# Patient Record
Sex: Female | Born: 1950 | Race: Black or African American | Hispanic: No | Smoking: Never smoker
Health system: Southern US, Community
[De-identification: ages and names within clinical notes are randomized; demographics above are authoritative.]

## PROBLEM LIST (undated history)

## (undated) DIAGNOSIS — I10 Essential (primary) hypertension: Secondary | ICD-10-CM

## (undated) DIAGNOSIS — E119 Type 2 diabetes mellitus without complications: Secondary | ICD-10-CM

## (undated) DIAGNOSIS — C801 Malignant (primary) neoplasm, unspecified: Secondary | ICD-10-CM

## (undated) DIAGNOSIS — I509 Heart failure, unspecified: Secondary | ICD-10-CM

## (undated) HISTORY — DX: Type 2 diabetes mellitus without complications: E11.9

## (undated) HISTORY — DX: Malignant (primary) neoplasm, unspecified: C80.1

## (undated) HISTORY — PX: EYE SURGERY: SHX253

## (undated) HISTORY — PX: CHOLECYSTECTOMY: SHX55

## (undated) HISTORY — DX: Heart failure, unspecified: I50.9

## (undated) HISTORY — DX: Essential (primary) hypertension: I10

## (undated) HISTORY — PX: ATRIAL FIBRILLATION ABLATION: EP1191

## (undated) HISTORY — PX: ABDOMINAL HYSTERECTOMY: SHX81

---

## 2013-05-03 DIAGNOSIS — I10 Essential (primary) hypertension: Secondary | ICD-10-CM | POA: Insufficient documentation

## 2013-05-03 DIAGNOSIS — I4729 Other ventricular tachycardia: Secondary | ICD-10-CM | POA: Insufficient documentation

## 2013-05-03 DIAGNOSIS — R55 Syncope and collapse: Secondary | ICD-10-CM | POA: Insufficient documentation

## 2013-05-03 DIAGNOSIS — I472 Ventricular tachycardia: Secondary | ICD-10-CM | POA: Insufficient documentation

## 2013-05-03 DIAGNOSIS — I4719 Other supraventricular tachycardia: Secondary | ICD-10-CM | POA: Insufficient documentation

## 2013-05-03 DIAGNOSIS — E119 Type 2 diabetes mellitus without complications: Secondary | ICD-10-CM | POA: Insufficient documentation

## 2013-05-03 DIAGNOSIS — I428 Other cardiomyopathies: Secondary | ICD-10-CM | POA: Insufficient documentation

## 2013-05-03 DIAGNOSIS — I471 Supraventricular tachycardia: Secondary | ICD-10-CM | POA: Insufficient documentation

## 2013-06-28 ENCOUNTER — Encounter: Payer: Self-pay | Admitting: *Deleted

## 2013-06-28 ENCOUNTER — Encounter: Payer: No Typology Code available for payment source | Attending: Family Medicine | Admitting: *Deleted

## 2013-06-28 VITALS — Ht 65.0 in | Wt 182.0 lb

## 2013-06-28 DIAGNOSIS — R5381 Other malaise: Secondary | ICD-10-CM | POA: Insufficient documentation

## 2013-06-28 DIAGNOSIS — R5383 Other fatigue: Secondary | ICD-10-CM

## 2013-06-28 DIAGNOSIS — Z713 Dietary counseling and surveillance: Secondary | ICD-10-CM | POA: Insufficient documentation

## 2013-06-28 DIAGNOSIS — E119 Type 2 diabetes mellitus without complications: Secondary | ICD-10-CM | POA: Insufficient documentation

## 2013-06-28 DIAGNOSIS — H548 Legal blindness, as defined in USA: Secondary | ICD-10-CM | POA: Insufficient documentation

## 2013-06-28 NOTE — Patient Instructions (Addendum)
Plan:  Aim for 2-3 Carb Choices per meal (30-45 grams) +/- 1 either way  Aim for 0-15 Carbs per snack if hungry  Include protein in moderation with your meals and snacks Consider reading food labels for Total Carbohydrate and Fat Grams of foods Consider  increasing your activity level by as tolerated Continue checking BG at FBS and evening times per day as directed by MD  Consider starting Lantus as directed by MD  Always have a protein when you have a carbohydrate Consider Lynnae Sandhoff / [facility] reduced calorie (45-50 cal ) bread Consider [place] for snack

## 2013-06-28 NOTE — Progress Notes (Signed)
Appt start time: 0830 end time:  1000.  Assessment:  Patient was seen on  06/28/13 for individual diabetes education. She was diagnosed approximately 20 yrs ago. Robyn Shaw presents in frail and in a wheel chair accompanied by her daughter Robyn Shaw.She is ambulatory yet fatigues easily as well as being challenged by visual impairment. Robyn Shaw was recently widowed in 04/2013. She has had cataract surgery and recent hospitalization for CHF. She is legally blind and is preparing for further eye surgery. She presently lives with her son and grandson. Her daughter is with her during the day M-TH, she is alone on Friday and her son is home on the week-end.   Current HbA1c: 8.4% 2015, up from 6.0% in 2013  Preferred Learning Style:   Auditory  Hands on  Learning Readiness:   Change in progress  MEDICATIONS: See List: Glipizide, Novolog sliding scale beginning at 1u > 150mg /dl. She tests FBS and evening annd utilizes SS 1-2 times daily. Lantus 10u HS has been ordered to increase 1u every 2 days to a FBS goal of <120. They has not started this yet as they are awaiting insurance approval for the "pen". I have encouraged them to follow up with insurance today and if approval will be delayed to begin with vial and syringe. Robyn Shaw goal is to return to independ living in her own home.  DIETARY INTAKE:  Beverages: water, sugar free lemonade, diet soda, decaf tea with splenda,   Usual physical activity: none    Intervention:  Nutrition counseling provided.  Discussed diabetes disease process and treatment options.  Discussed physiology of diabetes and role of obesity on insulin resistance.  Encouraged moderate weight reduction to improve glucose levels.  Discussed role of medications and diet in glucose control  Provided education on macronutrients on glucose levels.  Provided education on carb counting, importance of regularly scheduled meals/snacks, and meal planning  Discussed effects of  physical activity on glucose levels and long-term glucose control.  Recommended 150 minutes of physical activity/week.  Reviewed patient medications.  Discussed role of medication on blood glucose and possible side effects  Discussed blood glucose monitoring and interpretation.  Discussed recommended target ranges and individual ranges.    Described short-term complications: hyper- and hypo-glycemia.  Discussed causes,symptoms, and treatment options.  Discussed prevention, detection, and treatment of long-term complications.  Discussed the role of prolonged elevated glucose levels on body systems.  Discussed role of stress on blood glucose levels and discussed strategies to manage psychosocial issues.  Discussed recommendations for long-term diabetes self-care.  Established checklist for medical, dental, and emotional self-care.  Plan:  Aim for 2-3 Carb Choices per meal (30-45 grams) +/- 1 either way  Aim for 0-15 Carbs per snack if hungry  Include protein in moderation with your meals and snacks Consider reading food labels for Total Carbohydrate and Fat Grams of foods Consider  increasing your activity level by as tolerated Continue checking BG at FBS and evening times per day as directed by MD  Consider starting Lantus as directed by MD  Always have a protein when you have a carbohydrate Consider Robyn Shaw / Robyn Shaw reduced calorie (45-50 cal ) bread Consider Robyn Shaw for snack  Teaching Method Utilized:  Visual Auditory Hands on  Handouts given during visit include: Living Well with Diabetes Carb Counting and Food Label handouts Meal Plan Card Snack sheet  Barriers to learning/adherence to lifestyle change: General health condition  Diabetes self-care support plan:   [facility]  support group  family  Demonstrated degree of understanding via:  Teach Back   Monitoring/Evaluation:  Dietary intake, exercise, test glucose, and body weight return for f/u  prn.

## 2013-06-29 ENCOUNTER — Ambulatory Visit
Payer: No Typology Code available for payment source | Attending: Family Medicine | Admitting: Rehabilitative and Restorative Service Providers"

## 2013-06-29 DIAGNOSIS — M6281 Muscle weakness (generalized): Secondary | ICD-10-CM | POA: Insufficient documentation

## 2013-06-29 DIAGNOSIS — R269 Unspecified abnormalities of gait and mobility: Secondary | ICD-10-CM | POA: Insufficient documentation

## 2013-06-29 DIAGNOSIS — IMO0001 Reserved for inherently not codable concepts without codable children: Secondary | ICD-10-CM | POA: Insufficient documentation

## 2013-07-03 ENCOUNTER — Ambulatory Visit: Payer: No Typology Code available for payment source | Admitting: Rehabilitative and Restorative Service Providers"

## 2013-07-05 ENCOUNTER — Ambulatory Visit: Payer: No Typology Code available for payment source | Admitting: Rehabilitative and Restorative Service Providers"

## 2013-07-10 ENCOUNTER — Ambulatory Visit: Payer: No Typology Code available for payment source | Admitting: Rehabilitative and Restorative Service Providers"

## 2013-07-11 ENCOUNTER — Encounter: Payer: No Typology Code available for payment source | Admitting: Rehabilitative and Restorative Service Providers"

## 2013-07-12 ENCOUNTER — Encounter: Payer: No Typology Code available for payment source | Admitting: Rehabilitative and Restorative Service Providers"

## 2013-07-17 ENCOUNTER — Encounter: Payer: No Typology Code available for payment source | Admitting: Rehabilitative and Restorative Service Providers"

## 2013-07-18 ENCOUNTER — Ambulatory Visit
Payer: No Typology Code available for payment source | Attending: Family Medicine | Admitting: Rehabilitative and Restorative Service Providers"

## 2013-07-18 DIAGNOSIS — IMO0001 Reserved for inherently not codable concepts without codable children: Secondary | ICD-10-CM | POA: Insufficient documentation

## 2013-07-18 DIAGNOSIS — M6281 Muscle weakness (generalized): Secondary | ICD-10-CM | POA: Insufficient documentation

## 2013-07-18 DIAGNOSIS — R269 Unspecified abnormalities of gait and mobility: Secondary | ICD-10-CM | POA: Insufficient documentation

## 2013-07-19 ENCOUNTER — Ambulatory Visit: Payer: No Typology Code available for payment source | Admitting: Rehabilitative and Restorative Service Providers"

## 2013-08-01 DIAGNOSIS — I2510 Atherosclerotic heart disease of native coronary artery without angina pectoris: Secondary | ICD-10-CM | POA: Insufficient documentation

## 2013-08-01 DIAGNOSIS — Z95810 Presence of automatic (implantable) cardiac defibrillator: Secondary | ICD-10-CM | POA: Insufficient documentation

## 2013-09-07 DIAGNOSIS — I480 Paroxysmal atrial fibrillation: Secondary | ICD-10-CM | POA: Insufficient documentation

## 2013-10-09 ENCOUNTER — Ambulatory Visit: Payer: No Typology Code available for payment source | Attending: Family Medicine

## 2013-10-09 DIAGNOSIS — M129 Arthropathy, unspecified: Secondary | ICD-10-CM | POA: Insufficient documentation

## 2013-10-09 DIAGNOSIS — M25519 Pain in unspecified shoulder: Secondary | ICD-10-CM | POA: Insufficient documentation

## 2013-10-09 DIAGNOSIS — E119 Type 2 diabetes mellitus without complications: Secondary | ICD-10-CM | POA: Insufficient documentation

## 2013-10-09 DIAGNOSIS — IMO0001 Reserved for inherently not codable concepts without codable children: Secondary | ICD-10-CM | POA: Diagnosis present

## 2013-10-09 DIAGNOSIS — R5381 Other malaise: Secondary | ICD-10-CM | POA: Insufficient documentation

## 2013-10-09 DIAGNOSIS — Z950 Presence of cardiac pacemaker: Secondary | ICD-10-CM | POA: Diagnosis not present

## 2013-10-09 DIAGNOSIS — M25619 Stiffness of unspecified shoulder, not elsewhere classified: Secondary | ICD-10-CM | POA: Insufficient documentation

## 2013-10-11 ENCOUNTER — Ambulatory Visit: Payer: No Typology Code available for payment source | Admitting: Physical Therapy

## 2013-10-11 DIAGNOSIS — IMO0001 Reserved for inherently not codable concepts without codable children: Secondary | ICD-10-CM | POA: Diagnosis not present

## 2013-10-16 ENCOUNTER — Ambulatory Visit: Payer: No Typology Code available for payment source | Admitting: Physical Therapy

## 2013-10-16 DIAGNOSIS — IMO0001 Reserved for inherently not codable concepts without codable children: Secondary | ICD-10-CM | POA: Diagnosis not present

## 2013-10-26 ENCOUNTER — Ambulatory Visit: Payer: No Typology Code available for payment source | Admitting: Physical Therapy

## 2013-10-26 DIAGNOSIS — IMO0001 Reserved for inherently not codable concepts without codable children: Secondary | ICD-10-CM | POA: Diagnosis not present

## 2013-11-01 ENCOUNTER — Ambulatory Visit: Payer: No Typology Code available for payment source

## 2013-11-01 DIAGNOSIS — IMO0001 Reserved for inherently not codable concepts without codable children: Secondary | ICD-10-CM | POA: Diagnosis not present

## 2013-11-29 ENCOUNTER — Ambulatory Visit: Payer: No Typology Code available for payment source | Attending: Family Medicine

## 2013-11-29 DIAGNOSIS — E119 Type 2 diabetes mellitus without complications: Secondary | ICD-10-CM | POA: Diagnosis not present

## 2013-11-29 DIAGNOSIS — Z950 Presence of cardiac pacemaker: Secondary | ICD-10-CM | POA: Insufficient documentation

## 2013-11-29 DIAGNOSIS — IMO0001 Reserved for inherently not codable concepts without codable children: Secondary | ICD-10-CM | POA: Diagnosis not present

## 2013-11-29 DIAGNOSIS — R5381 Other malaise: Secondary | ICD-10-CM | POA: Diagnosis not present

## 2013-11-29 DIAGNOSIS — M25519 Pain in unspecified shoulder: Secondary | ICD-10-CM | POA: Insufficient documentation

## 2013-11-29 DIAGNOSIS — M25619 Stiffness of unspecified shoulder, not elsewhere classified: Secondary | ICD-10-CM | POA: Insufficient documentation

## 2013-11-29 DIAGNOSIS — M129 Arthropathy, unspecified: Secondary | ICD-10-CM | POA: Diagnosis not present

## 2013-12-06 ENCOUNTER — Ambulatory Visit: Payer: No Typology Code available for payment source

## 2013-12-06 DIAGNOSIS — IMO0001 Reserved for inherently not codable concepts without codable children: Secondary | ICD-10-CM | POA: Diagnosis not present

## 2013-12-11 ENCOUNTER — Ambulatory Visit: Payer: Medicare Other | Attending: Family Medicine

## 2013-12-11 DIAGNOSIS — E119 Type 2 diabetes mellitus without complications: Secondary | ICD-10-CM | POA: Insufficient documentation

## 2013-12-11 DIAGNOSIS — R5381 Other malaise: Secondary | ICD-10-CM | POA: Diagnosis not present

## 2013-12-11 DIAGNOSIS — M25612 Stiffness of left shoulder, not elsewhere classified: Secondary | ICD-10-CM | POA: Insufficient documentation

## 2013-12-11 DIAGNOSIS — Z5189 Encounter for other specified aftercare: Secondary | ICD-10-CM | POA: Diagnosis present

## 2013-12-11 DIAGNOSIS — M1990 Unspecified osteoarthritis, unspecified site: Secondary | ICD-10-CM | POA: Diagnosis not present

## 2013-12-11 DIAGNOSIS — M25512 Pain in left shoulder: Secondary | ICD-10-CM | POA: Insufficient documentation

## 2013-12-11 DIAGNOSIS — Z950 Presence of cardiac pacemaker: Secondary | ICD-10-CM | POA: Diagnosis not present

## 2014-06-11 DIAGNOSIS — H4310 Vitreous hemorrhage, unspecified eye: Secondary | ICD-10-CM | POA: Insufficient documentation

## 2014-11-20 DIAGNOSIS — T82198A Other mechanical complication of other cardiac electronic device, initial encounter: Secondary | ICD-10-CM | POA: Insufficient documentation

## 2016-02-04 DIAGNOSIS — G629 Polyneuropathy, unspecified: Secondary | ICD-10-CM | POA: Insufficient documentation

## 2016-02-04 DIAGNOSIS — R270 Ataxia, unspecified: Secondary | ICD-10-CM | POA: Insufficient documentation

## 2016-02-04 DIAGNOSIS — R251 Tremor, unspecified: Secondary | ICD-10-CM | POA: Insufficient documentation

## 2016-02-04 DIAGNOSIS — R600 Localized edema: Secondary | ICD-10-CM | POA: Insufficient documentation

## 2016-02-04 DIAGNOSIS — G508 Other disorders of trigeminal nerve: Secondary | ICD-10-CM | POA: Insufficient documentation

## 2016-02-04 DIAGNOSIS — E559 Vitamin D deficiency, unspecified: Secondary | ICD-10-CM | POA: Insufficient documentation

## 2016-02-04 DIAGNOSIS — R0989 Other specified symptoms and signs involving the circulatory and respiratory systems: Secondary | ICD-10-CM | POA: Insufficient documentation

## 2016-02-04 DIAGNOSIS — R42 Dizziness and giddiness: Secondary | ICD-10-CM | POA: Insufficient documentation

## 2016-02-04 DIAGNOSIS — R5383 Other fatigue: Secondary | ICD-10-CM | POA: Insufficient documentation

## 2016-02-04 DIAGNOSIS — H269 Unspecified cataract: Secondary | ICD-10-CM | POA: Insufficient documentation

## 2016-02-04 DIAGNOSIS — I110 Hypertensive heart disease with heart failure: Secondary | ICD-10-CM | POA: Insufficient documentation

## 2016-02-04 DIAGNOSIS — E113599 Type 2 diabetes mellitus with proliferative diabetic retinopathy without macular edema, unspecified eye: Secondary | ICD-10-CM | POA: Insufficient documentation

## 2016-02-04 DIAGNOSIS — E785 Hyperlipidemia, unspecified: Secondary | ICD-10-CM | POA: Insufficient documentation

## 2016-02-26 DIAGNOSIS — M7061 Trochanteric bursitis, right hip: Secondary | ICD-10-CM | POA: Insufficient documentation

## 2016-06-01 DIAGNOSIS — H02401 Unspecified ptosis of right eyelid: Secondary | ICD-10-CM | POA: Insufficient documentation

## 2016-07-03 DIAGNOSIS — H5440 Blindness, one eye, unspecified eye: Secondary | ICD-10-CM | POA: Insufficient documentation

## 2016-08-20 DIAGNOSIS — L602 Onychogryphosis: Secondary | ICD-10-CM | POA: Insufficient documentation

## 2016-11-15 DIAGNOSIS — R413 Other amnesia: Secondary | ICD-10-CM | POA: Insufficient documentation

## 2017-03-15 DIAGNOSIS — Z0000 Encounter for general adult medical examination without abnormal findings: Secondary | ICD-10-CM | POA: Insufficient documentation

## 2017-04-02 DIAGNOSIS — F419 Anxiety disorder, unspecified: Secondary | ICD-10-CM | POA: Insufficient documentation

## 2017-04-06 DIAGNOSIS — G4700 Insomnia, unspecified: Secondary | ICD-10-CM | POA: Insufficient documentation

## 2017-04-12 ENCOUNTER — Emergency Department (HOSPITAL_COMMUNITY): Payer: Medicare HMO

## 2017-04-12 ENCOUNTER — Other Ambulatory Visit: Payer: Self-pay

## 2017-04-12 ENCOUNTER — Emergency Department (HOSPITAL_COMMUNITY)
Admission: EM | Admit: 2017-04-12 | Discharge: 2017-04-12 | Disposition: A | Discharge status: Home / Self Care | Payer: Medicare HMO | Attending: Emergency Medicine | Admitting: Emergency Medicine

## 2017-04-12 ENCOUNTER — Encounter (HOSPITAL_COMMUNITY): Payer: Self-pay

## 2017-04-12 DIAGNOSIS — Z7982 Long term (current) use of aspirin: Secondary | ICD-10-CM | POA: Insufficient documentation

## 2017-04-12 DIAGNOSIS — Z8543 Personal history of malignant neoplasm of ovary: Secondary | ICD-10-CM | POA: Diagnosis not present

## 2017-04-12 DIAGNOSIS — E119 Type 2 diabetes mellitus without complications: Secondary | ICD-10-CM | POA: Insufficient documentation

## 2017-04-12 DIAGNOSIS — I509 Heart failure, unspecified: Secondary | ICD-10-CM | POA: Diagnosis not present

## 2017-04-12 DIAGNOSIS — Z4502 Encounter for adjustment and management of automatic implantable cardiac defibrillator: Secondary | ICD-10-CM | POA: Diagnosis not present

## 2017-04-12 DIAGNOSIS — Z794 Long term (current) use of insulin: Secondary | ICD-10-CM | POA: Diagnosis not present

## 2017-04-12 DIAGNOSIS — I110 Hypertensive heart disease with heart failure: Secondary | ICD-10-CM | POA: Insufficient documentation

## 2017-04-12 DIAGNOSIS — Z79899 Other long term (current) drug therapy: Secondary | ICD-10-CM | POA: Diagnosis not present

## 2017-04-12 LAB — CBC
HCT: 40.4 % (ref 36.0–46.0)
Hemoglobin: 13.4 g/dL (ref 12.0–15.0)
MCH: 31.8 pg (ref 26.0–34.0)
MCHC: 33.2 g/dL (ref 30.0–36.0)
MCV: 96 fL (ref 78.0–100.0)
Platelets: 210 10*3/uL (ref 150–400)
RBC: 4.21 MIL/uL (ref 3.87–5.11)
RDW: 13.6 % (ref 11.5–15.5)
WBC: 7.2 10*3/uL (ref 4.0–10.5)

## 2017-04-12 LAB — BASIC METABOLIC PANEL
Anion gap: 13 (ref 5–15)
BUN: 27 mg/dL — ABNORMAL HIGH (ref 6–20)
CO2: 26 mmol/L (ref 22–32)
Calcium: 9.5 mg/dL (ref 8.9–10.3)
Chloride: 100 mmol/L — ABNORMAL LOW (ref 101–111)
Creatinine, Ser: 1.1 mg/dL — ABNORMAL HIGH (ref 0.44–1.00)
GFR calc Af Amer: 58 mL/min — ABNORMAL LOW (ref >60)
GFR calc non Af Amer: 50 mL/min — ABNORMAL LOW (ref >60)
Glucose, Bld: 140 mg/dL — ABNORMAL HIGH (ref 65–99)
Potassium: 3.8 mmol/L (ref 3.5–5.1)
Sodium: 139 mmol/L (ref 135–145)

## 2017-04-12 LAB — I-STAT TROPONIN, ED: Troponin i, poc: 0.7 ng/mL (ref 0.00–0.08)

## 2017-04-12 MED ORDER — METOPROLOL SUCCINATE ER 50 MG PO TB24: 50.0000 mg | ORAL_TABLET | Freq: Every day | ORAL | 0 refills | Status: AC

## 2017-04-12 MED ORDER — DIAZEPAM 5 MG PO TABS
5.0000 mg | ORAL_TABLET | Freq: Once | ORAL | Status: AC
  Administered 2017-04-12: 5 mg via ORAL
  Filled 2017-04-12: qty 1

## 2017-04-12 NOTE — ED Triage Notes (Addendum)
Per Pt, Pt is coming from home with complaints of Pacemaker shocking her four times in the middle of the night last night. Son reports that she woke up and reported feeling like someone was punching her in the chest. Pt reports that she was sleeping and they woke her up. Denies any pain or SOB at this time.

## 2017-04-12 NOTE — Discharge Instructions (Signed)
I discussed with Dr Jac Canavan. The cardiology office is to call you in the morning to arrange for follow-up.

## 2017-04-12 NOTE — ED Notes (Signed)
BS report arrived for patient

## 2017-04-12 NOTE — ED Notes (Signed)
Pt daughter verbalized understanding of discharge instructions.

## 2017-04-12 NOTE — ED Provider Notes (Addendum)
[facility] EMERGENCY DEPARTMENT Provider Note   CSN: [id number] Arrival date & time: 04/12/17  1055     History   Chief Complaint Chief Complaint  Patient presents with  . Pacemaker Shock    HPI Robyn Shaw is a 67 y.o. female.  HPI   67 year old female w/ a past hx of paroxysmal atrial fibrillation, nonobstructive coronary disease, nonischemic cardiomyopathy, status post ICD implantation for primary prevention presenting after her ICD discharged last night. She reports that this happened while she was sleeping.  She felt like she was "punched in the chest."  She said that it happened multiple times.  She reports that she was notified that it had fired 4 times.  She says that she has been in her usual state of health for the last few days.  No recent medication changes.  Currently feels nervous but no acute pain or dyspnea.  Past Medical History:  Diagnosis Date  . Cancer ([place])    ovarian  . CHF (congestive heart failure) ([place])   . Diabetes mellitus without complication ([place])   . Hypertension     There are no active problems to display for this patient.   Past Surgical History:  Procedure Laterality Date  . ABDOMINAL HYSTERECTOMY    . ATRIAL FIBRILLATION ABLATION    . CHOLECYSTECTOMY    . EYE SURGERY      OB History    No data available       Home Medications    Prior to Admission medications   Medication Sig Start Date End Date Taking? Authorizing Provider  aspirin 81 MG tablet Take 81 mg by mouth daily.    [provider]  atorvastatin (LIPITOR) 40 MG tablet Take 40 mg by mouth daily.    [provider]  carvedilol (COREG) 25 MG tablet Take 25 mg by mouth 2 (two) times daily with a meal.    [provider]  furosemide (LASIX) 80 MG tablet Take 80 mg by mouth 2 (two) times daily.    [provider]  glipiZIDE (GLUCOTROL) 5 MG tablet Take 5 mg by mouth daily before breakfast.    [provider]    insulin aspart (NOVOLOG) 100 UNIT/ML injection Inject 1 Units into the skin 2 (two) times daily. Novolog flexPen sliding scale beginning at 1u > 150mg /dl    [provider]  insulin glargine (LANTUS) 100 UNIT/ML injection Inject 10 Units into the skin at bedtime. Lantus 10 units to begin. Increase by 1u every 2 days to a goal of FBS 120mg /dl    [provider]  lisinopril (PRINIVIL,ZESTRIL) 10 MG tablet Take 20 mg by mouth. 10mg  tablets, 2 tablets in the morning and 1 tablet in the evening    [provider]  meclizine (ANTIVERT) 12.5 MG tablet Take 12.5 mg by mouth 4 (four) times daily as needed for dizziness.    [provider]  metoprolol succinate (TOPROL-XL) 25 MG 24 hr tablet Take 25 mg by mouth 2 (two) times daily.    [provider]  POTASSIUM CHLORIDE ER PO Take 20 mEq by mouth daily.    [provider]    Family History No family history on file.  Social History Social History   Tobacco Use  . Smoking status: Never Smoker  . Smokeless tobacco: Never Used  Substance Use Topics  . Alcohol use: No    Frequency: Never  . Drug use: No     Allergies   Patient has  no known allergies.   Review of Systems Review of Systems  All systems reviewed and negative, other than as noted in HPI.  Physical Exam Updated Vital Signs BP (!) 187/88 (BP Location: Right Arm)   Pulse 65   Temp 98.4 F (36.9 C) (Oral)   Resp (!) 22   Ht 5\' 5"  (1.651 m)   Wt 82.6 kg (182 lb)   SpO2 98%   BMI 30.29 kg/m   Physical Exam  Constitutional: She appears well-developed and well-nourished. No distress.  HENT:  Head: Normocephalic and atraumatic.  Eyes: Conjunctivae are normal. Right eye exhibits no discharge. Left eye exhibits no discharge.  Neck: Neck supple.  Cardiovascular: Normal rate, regular rhythm and normal heart sounds. Exam reveals no gallop and no friction rub.  No murmur heard. Pulmonary/Chest: Effort normal and breath  sounds normal. No respiratory distress.  Abdominal: Soft. She exhibits no distension. There is no tenderness.  Musculoskeletal: She exhibits no edema or tenderness.  Neurological: She is alert.  Skin: Skin is warm and dry.  Psychiatric: She has a normal mood and affect. Her behavior is normal. Thought content normal.  Nursing note and vitals reviewed.    ED Treatments / Results  Labs (all labs ordered are listed, but only abnormal results are displayed) Labs Reviewed  BASIC METABOLIC PANEL - Abnormal; Notable for the following components:      Result Value   Chloride 100 (*)    Glucose, Bld 140 (*)    BUN 27 (*)    Creatinine, Ser 1.10 (*)    GFR calc non Af Amer 50 (*)    GFR calc Af Amer 58 (*)    All other components within normal limits  I-STAT TROPONIN, ED - Abnormal; Notable for the following components:   Troponin i, poc 0.70 (*)    All other components within normal limits  CBC    EKG  EKG Interpretation  Date/Time:  Monday April 12 2017 11:06:26 EST Ventricular Rate:  67 PR Interval:  186 QRS Duration: 116 QT Interval:  418 QTC Calculation: 441 R Axis:   -83 Text Interpretation:  Normal sinus rhythm Left anterior fascicular block Left ventricular hypertrophy with QRS widening Cannot rule out Septal infarct , age undetermined Possible Lateral infarct , age undetermined Abnormal ECG No old tracing to compare Confirmed by Virgel Manifold [phone]) on 04/12/2017 4:35:57 PM       Radiology Dg Chest 2 View  Result Date: 04/12/2017 CLINICAL DATA:  For episodes of ICD firing last night. Sensation of being punched in the chest. No current symptoms. History of CHF. Never smoked. EXAM: CHEST  2 VIEW COMPARISON:  None in PACs FINDINGS: There elevation of the right hemidiaphragm. The lungs are clear. There is no pleural effusion or pneumothorax. The heart is top-normal in size. The pulmonary vascularity is normal. The ICD is in reasonable position. The bony thorax exhibits no  acute abnormality. IMPRESSION: There is no active cardiopulmonary disease. There elevation of the right hemidiaphragm of uncertain significance and duration. Electronically Signed   By: David  Martinique M.D.   On: 04/12/2017 12:06    Procedures Procedures (including critical care time)  Medications Ordered in ED Medications - No data to display   Initial Impression / Assessment and Plan / ED Course  I have reviewed the triage vital signs and the nursing notes.  Pertinent labs & imaging results that were available during my care of the patient were reviewed by me and considered in my medical  decision making (see chart for details).     67 year old female presenting after ICD discharge last night.  Sports coach.  We will interrogate then discuss with her cardiologist.  Currently no complaints aside from feeling nervous.  Her troponin is elevated but not surprising for ICD discharge multiple times. No significant CAD on previous cath.   Received call from [employer].  Shortly after 1 AM this morning patient had an episode of A. Fib/flutter with RVR with a rate of 212.  Threshold set at 200 so she was appropriately shocked at that time. Advised he would discussed with on-call EP. Pt's cardiologist is with [facility] though. Requested secretary page Dr Fuller Song, cardiology.   Discussed with Dr Jac Canavan. No acute changes. Cards office to contact her tomorrow to arrange follow-up.   4:52 PM Dr Jac Canavan advised increasing metoprolol to 75mg  BID from 50mg . She has her meds her and I reviewed them with her/daughter. She actually is taking 100mg  ER once daily. New prescription for 50mg  provided and advised her to take 100mg  in morning as she currently is and add 50mg  in the evening until she follows up.    Final Clinical Impressions(s) / ED Diagnoses   Final diagnoses:  ICD (implantable cardioverter-defibrillator) discharge    ED Discharge Orders    None         Virgel Manifold, MD 04/12/17 1638    Virgel Manifold, MD 04/12/17 1654

## 2017-05-04 ENCOUNTER — Ambulatory Visit: Payer: Medicare HMO | Admitting: Sports Medicine

## 2017-05-04 ENCOUNTER — Encounter: Payer: Self-pay | Admitting: Sports Medicine

## 2017-05-04 VITALS — BP 161/98 | HR 63 | Ht 66.0 in | Wt 212.0 lb

## 2017-05-04 DIAGNOSIS — E0842 Diabetes mellitus due to underlying condition with diabetic polyneuropathy: Secondary | ICD-10-CM

## 2017-05-04 DIAGNOSIS — H547 Unspecified visual loss: Secondary | ICD-10-CM

## 2017-05-04 DIAGNOSIS — M79675 Pain in left toe(s): Secondary | ICD-10-CM | POA: Diagnosis not present

## 2017-05-04 DIAGNOSIS — M79674 Pain in right toe(s): Secondary | ICD-10-CM | POA: Diagnosis not present

## 2017-05-04 DIAGNOSIS — B351 Tinea unguium: Secondary | ICD-10-CM | POA: Diagnosis not present

## 2017-05-04 NOTE — Progress Notes (Signed)
Subjective: Robyn Shaw is a 67 y.o. female patient with history of diabetes who presents to office today complaining of long,mildly painful nails  while ambulating in shoes; unable to trim. Patient states that the glucose reading this morning was was not recorded, patient is assisted by son who helps to relay history. Patient denies any new changes in medication or new problems. Reports that she hasn't had her nails trimmed in almost 2 years because of fear of doctors.  Review of Systems  Eyes:       Blind  All other systems reviewed and are negative.   There are no active problems to display for this patient.  Current Outpatient Medications on File Prior to Visit  Medication Sig Dispense Refill  . atorvastatin (LIPITOR) 40 MG tablet Take 40 mg by mouth daily.    . carvedilol (COREG) 25 MG tablet Take 25 mg by mouth 2 (two) times daily with a meal.    . CINNAMON PO Take by mouth.    . donepezil (ARICEPT) 5 MG tablet Take 5 mg by mouth daily.     Marland Kitchen FLUoxetine (PROZAC) 20 MG capsule Take 20 mg by mouth daily.  5  . furosemide (LASIX) 80 MG tablet Take 80 mg by mouth 2 (two) times daily.    Marland Kitchen GARLIC PO Take 4,259 mg by mouth daily.    . Ginger, Zingiber officinalis, 250 MG CAPS Take by mouth.    . insulin glargine (LANTUS) 100 UNIT/ML injection Inject 26 Units into the skin at bedtime.     Marland Kitchen lisinopril (PRINIVIL,ZESTRIL) 10 MG tablet Take 20 mg by mouth. '10mg'$  tablets, 2 tablets in the morning and 1 tablet in the evening    . metFORMIN (GLUCOPHAGE-XR) 500 MG 24 hr tablet Take 500 mg by mouth every evening.    . metoprolol succinate (TOPROL-XL) 100 MG 24 hr tablet Take 100 mg by mouth daily.     . metoprolol succinate (TOPROL-XL) 50 MG 24 hr tablet Take 1 tablet (50 mg total) by mouth daily. 30 tablet 0  . Misc Natural Products (GLUCOSAMINE CHOND COMPLEX/MSM) TABS Take by mouth.    . Multiple Vitamin (THERA) TABS Take 1 mg by mouth 2 (two) times daily.     . Omega-3 Fatty Acids (FISH OIL)  1000 MG CAPS Take 1,000 mg by mouth daily.    . ondansetron (ZOFRAN) 4 MG tablet Take 4 mg by mouth as needed.    Marland Kitchen OVER THE COUNTER MEDICATION Take 8.3 mg by mouth daily. Hemp oil    . potassium chloride (K-DUR) 10 MEQ tablet Take 20 mEq by mouth daily.    . QUEtiapine (SEROQUEL) 25 MG tablet Take 25 mg by mouth at bedtime.  5   No current facility-administered medications on file prior to visit.    No Known Allergies  Recent Results (from the past 2160 hour(s))  Basic metabolic panel     Status: Abnormal   Collection Time: 04/12/17 11:14 AM  Result Value Ref Range   Sodium 139 135 - 145 mmol/L   Potassium 3.8 3.5 - 5.1 mmol/L   Chloride 100 (L) 101 - 111 mmol/L   CO2 26 22 - 32 mmol/L   Glucose, Bld 140 (H) 65 - 99 mg/dL   BUN 27 (H) 6 - 20 mg/dL   Creatinine, Ser 1.10 (H) 0.44 - 1.00 mg/dL   Calcium 9.5 8.9 - 10.3 mg/dL   GFR calc non Af Amer 50 (L) >60 mL/min   GFR calc Af Wyvonnia Lora  58 (L) >60 mL/min    Comment: (NOTE) The eGFR has been calculated using the CKD EPI equation. This calculation has not been validated in all clinical situations. eGFR's persistently <60 mL/min signify possible Chronic Kidney Disease.    Anion gap 13 5 - 15    Comment: Performed at [facility] [address], [place] [address]  CBC     Status: None   Collection Time: 04/12/17 11:14 AM  Result Value Ref Range   WBC 7.2 4.0 - 10.5 K/uL   RBC 4.21 3.87 - 5.11 MIL/uL   Hemoglobin 13.4 12.0 - 15.0 g/dL   HCT 40.4 36.0 - 46.0 %   MCV 96.0 78.0 - 100.0 fL   MCH 31.8 26.0 - 34.0 pg   MCHC 33.2 30.0 - 36.0 g/dL   RDW 13.6 11.5 - 15.5 %   Platelets 210 150 - 400 K/uL    Comment: Performed at [facility] [address], [place], [place] [address]  I-stat troponin, ED     Status: Abnormal   Collection Time: 04/12/17 11:33 AM  Result Value Ref Range   Troponin i, poc 0.70 (HH) 0.00 - 0.08 ng/mL   Comment NOTIFIED PHYSICIAN    Comment 3            Comment: Due to the release  kinetics of cTnI, a negative result within the first hours of the onset of symptoms does not rule out myocardial infarction with certainty. If myocardial infarction is still suspected, repeat the test at appropriate intervals.     Objective: General: Patient is awake, alert, and oriented x 3 and in no acute distress.  Integument: Skin is warm, dry and supple bilateral. Nails are tender, long, thickened and dystrophic with subungual debris, consistent with onychomycosis, 1-5 bilateral. No signs of infection. No open lesions or preulcerative lesions present bilateral. Remaining integument unremarkable.  Vasculature:  Dorsalis Pedis pulse 1/4 bilateral. Posterior Tibial pulse  1/4 bilateral. Capillary fill time <3 sec 1-5 bilateral. Positive hair growth to the level of the digits.Temperature gradient within normal limits. + varicosities present bilateral. Trace edema present bilateral.   Neurology: The patient has diminished sensation measured with a 5.07/10g Semmes Weinstein Monofilament at all pedal sites bilateral . Vibratory sensation diminished bilateral with tuning fork. No Babinski sign present bilateral.   Musculoskeletal: Asymptomatic hammertoe pedal deformities noted bilateral. Muscular strength 5/5 in all lower extremity muscular groups bilateral without pain on range of motion. No tenderness with calf compression bilateral.  Assessment and Plan: Problem List Items Addressed This Visit    None    Visit Diagnoses    Pain due to onychomycosis of toenails of both feet    -  Primary   Diabetic polyneuropathy associated with diabetes mellitus due to underlying condition ([place])       Blind          -Examined patient. -Discussed and educated patient on diabetic foot care, especially with  regards to the vascular, neurological and musculoskeletal systems.  -Stressed the importance of good glycemic control and the detriment of not  controlling glucose levels in relation to the  foot. -Mechanically debrided all nails 1-5 bilateral using sterile nail nipper and filed with dremel without incident  -Answered all patient and sons questions -Patient to return  in 3 months for at risk foot care -Patient advised to call the office if any problems or questions arise in the meantime.  Landis Martins, DPM

## 2017-05-04 NOTE — Progress Notes (Signed)
   Subjective:    Patient ID: Robyn Shaw, female    DOB: 1950/04/15, 67 y.o.   MRN: [id number]  HPI  Chief Complaint  Patient presents with  . Diabetes    Well controlled - diabetic foot exam  . Nail Problem    bilateral elongated thickened toenails - she is unable to trim herself       Review of Systems  Eyes: Positive for visual disturbance.  Cardiovascular: Positive for palpitations.  Gastrointestinal: Positive for nausea.  Musculoskeletal: Positive for arthralgias, back pain, joint swelling and myalgias.  Neurological: Positive for dizziness, weakness and light-headedness.  Psychiatric/Behavioral: Positive for confusion.       Objective:   Physical Exam        Assessment & Plan:

## 2017-08-03 ENCOUNTER — Ambulatory Visit: Payer: Medicare HMO | Admitting: Sports Medicine

## 2017-08-03 ENCOUNTER — Encounter: Payer: Self-pay | Admitting: Sports Medicine

## 2017-08-03 DIAGNOSIS — M79675 Pain in left toe(s): Secondary | ICD-10-CM

## 2017-08-03 DIAGNOSIS — H547 Unspecified visual loss: Secondary | ICD-10-CM

## 2017-08-03 DIAGNOSIS — M79674 Pain in right toe(s): Secondary | ICD-10-CM | POA: Diagnosis not present

## 2017-08-03 DIAGNOSIS — B351 Tinea unguium: Secondary | ICD-10-CM | POA: Diagnosis not present

## 2017-08-03 DIAGNOSIS — E0842 Diabetes mellitus due to underlying condition with diabetic polyneuropathy: Secondary | ICD-10-CM | POA: Diagnosis not present

## 2017-08-03 NOTE — Progress Notes (Signed)
Subjective: Robyn Shaw is a 67 y.o. 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 Patient states that the glucose reading this morning was was not recorded, patient is assisted by son who helps to relay history. Denies any other acute issues since last visit.    There are no active problems to display for this patient.  Current Outpatient Medications on File Prior to Visit  Medication Sig Dispense Refill  . atorvastatin (LIPITOR) 40 MG tablet Take 40 mg by mouth daily.    . carvedilol (COREG) 25 MG tablet Take 25 mg by mouth 2 (two) times daily with a meal.    . CINNAMON PO Take by mouth.    . donepezil (ARICEPT) 5 MG tablet Take 5 mg by mouth daily.     Marland Kitchen FLUoxetine (PROZAC) 20 MG capsule Take 20 mg by mouth daily.  5  . furosemide (LASIX) 80 MG tablet Take 80 mg by mouth 2 (two) times daily.    Marland Kitchen GARLIC PO Take 1,937 mg by mouth daily.    . Ginger, Zingiber officinalis, 250 MG CAPS Take by mouth.    . insulin glargine (LANTUS) 100 UNIT/ML injection Inject 26 Units into the skin at bedtime.     Marland Kitchen lisinopril (PRINIVIL,ZESTRIL) 10 MG tablet Take 20 mg by mouth. 10mg  tablets, 2 tablets in the morning and 1 tablet in the evening    . metFORMIN (GLUCOPHAGE-XR) 500 MG 24 hr tablet Take 500 mg by mouth every evening.    . metoprolol succinate (TOPROL-XL) 100 MG 24 hr tablet Take 100 mg by mouth daily.     . metoprolol succinate (TOPROL-XL) 50 MG 24 hr tablet Take 1 tablet (50 mg total) by mouth daily. 30 tablet 0  . Misc Natural Products (GLUCOSAMINE CHOND COMPLEX/MSM) TABS Take by mouth.    . Multiple Vitamin (THERA) TABS Take 1 mg by mouth 2 (two) times daily.     . Omega-3 Fatty Acids (FISH OIL) 1000 MG CAPS Take 1,000 mg by mouth daily.    . ondansetron (ZOFRAN) 4 MG tablet Take 4 mg by mouth as needed.    Marland Kitchen OVER THE COUNTER MEDICATION Take 8.3 mg by mouth daily.  Hemp oil    . potassium chloride (K-DUR) 10 MEQ tablet Take 20 mEq by mouth daily.    . QUEtiapine (SEROQUEL) 25 MG tablet Take 25 mg by mouth at bedtime.  5   No current facility-administered medications on file prior to visit.    No Known Allergies  No results found for this or any previous visit (from the past 2160 hour(s)).  Objective: General: Patient is awake, alert, and oriented x 3 and in no acute distress, wheelchair assisted gait.  Integument: Skin is warm, dry and supple bilateral. Nails are tender, long, thickened and dystrophic with subungual debris, consistent with onychomycosis, 1-5 bilateral. No signs of infection. No open lesions or preulcerative lesions present bilateral. Remaining integument unremarkable.  Vasculature:  Dorsalis Pedis pulse 1/4 bilateral. Posterior Tibial pulse  1/4 bilateral. Capillary fill time <3 sec 1-5 bilateral. Positive hair growth to the level of the digits.Temperature gradient within normal limits. + varicosities present bilateral. Trace edema present bilateral ankles.   Neurology: The patient has diminished sensation measured with a 5.07/10g Semmes Weinstein Monofilament at all pedal sites bilateral . Vibratory sensation diminished bilateral with tuning fork. No Babinski sign present bilateral.   Musculoskeletal: Asymptomatic  hammertoe pedal deformities noted bilateral. Muscular strength 5/5 in all lower extremity muscular groups bilateral without pain on range of motion. No tenderness with calf compression bilateral.  Assessment and Plan: Problem List Items Addressed This Visit    None    Visit Diagnoses    Pain due to onychomycosis of toenails of both feet    -  Primary   Diabetic polyneuropathy associated with diabetes mellitus due to underlying condition ([place])       Blind          -Examined patient. -Discussed and educated patient on diabetic foot care, especially with  regards to the vascular, neurological and musculoskeletal  systems.  -Stressed the importance of good glycemic control and the detriment of not  controlling glucose levels in relation to the foot. -Mechanically debrided all nails 1-5 bilateral using sterile nail nipper and filed with dremel without incident  -Continue with wheelchair and assistance from son  -Patient to return  in 3 months for at risk foot care -Patient advised to call the office if any problems or questions arise in the meantime.  Landis Martins, DPM

## 2017-11-02 ENCOUNTER — Encounter: Payer: Self-pay | Admitting: Sports Medicine

## 2017-11-02 ENCOUNTER — Ambulatory Visit: Payer: Medicare HMO | Admitting: Sports Medicine

## 2017-11-02 DIAGNOSIS — E0842 Diabetes mellitus due to underlying condition with diabetic polyneuropathy: Secondary | ICD-10-CM | POA: Diagnosis not present

## 2017-11-02 DIAGNOSIS — H547 Unspecified visual loss: Secondary | ICD-10-CM | POA: Diagnosis not present

## 2017-11-02 DIAGNOSIS — M79674 Pain in right toe(s): Secondary | ICD-10-CM

## 2017-11-02 DIAGNOSIS — M79675 Pain in left toe(s): Secondary | ICD-10-CM

## 2017-11-02 DIAGNOSIS — B351 Tinea unguium: Secondary | ICD-10-CM

## 2017-11-02 NOTE — Progress Notes (Signed)
Subjective: Robyn Shaw is a 67 y.o. 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 Patient is assisted by son. Patient states that the glucose reading this morning was 121 this morning last A1c 6.1, denies any other issues.  Patient Active Problem List   Diagnosis Date Noted  . Insomnia 04/06/2017  . Anxiety 04/02/2017  . Encounter for Medicare annual wellness exam 03/15/2017  . Memory loss 11/15/2016  . Overgrown toenails 08/20/2016  . 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 Ptosis of eyelid, right 06/01/2016  . Trochanteric bursitis of right hip 02/26/2016  . Ataxia 02/04/2016  . Bilateral edema of lower extremity 02/04/2016  . 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 Cardiac LV ejection fraction 10-20% 02/04/2016  . Cataract 02/04/2016  . Congestive heart failure due to high blood pressure ([place]) 02/04/2016  . Fatigue 02/04/2016  . Hyperlipidemia 02/04/2016  . Neuropathy 02/04/2016  . Proliferative diabetic retinopathy associated with type 2 diabetes mellitus ([place]) 02/04/2016  . Tremor 02/04/2016  . Vertigo 02/04/2016  . Vitamin D deficiency 02/04/2016  . Inappropriate shocks from ICD (implantable cardioverter-defibrillator) 11/20/2014  . Vitreous hemorrhage ([place]) 06/11/2014  . Paroxysmal atrial fibrillation ([place]) 09/07/2013  . 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 Nonocclusive coronary atherosclerosis of native coronary artery 08/01/2013  . Nonischemic cardiomyopathy ([place]) 05/03/2013  . Essential hypertension 05/03/2013  . NSVT (nonsustained ventricular tachycardia) ([place]) 05/03/2013  . PAT (paroxysmal atrial tachycardia) ([place]) 05/03/2013  . Syncope 05/03/2013  . Type 2 diabetes mellitus ([place]) 05/03/2013   Current Outpatient Medications on File Prior to Visit  Medication Sig Dispense Refill  . atorvastatin (LIPITOR) 40 MG tablet  Take 40 mg by mouth daily. Pt takes 20 mg    . carvedilol (COREG) 25 MG tablet Take 25 mg by mouth 2 (two) times daily with a meal.    . CINNAMON PO Take by mouth.    . donepezil (ARICEPT) 5 MG tablet Take 5 mg by mouth daily.     Marland Kitchen FLUoxetine (PROZAC) 20 MG capsule Take 20 mg by mouth daily.  5  . furosemide (LASIX) 80 MG tablet Take 80 mg by mouth daily.     Marland Kitchen GARLIC PO Take 7,793 mg by mouth daily.    . Ginger, Zingiber officinalis, 250 MG CAPS Take by mouth.    . insulin glargine (LANTUS) 100 UNIT/ML injection Inject 26 Units into the skin at bedtime.     Marland Kitchen lisinopril (PRINIVIL,ZESTRIL) 10 MG tablet Take 20 mg by mouth. 10mg  tablets, 2 tablets in the morning and 1 tablet in the evening    . metFORMIN (GLUCOPHAGE-XR) 500 MG 24 hr tablet Take 500 mg by mouth every evening.    . metoprolol succinate (TOPROL-XL) 100 MG 24 hr tablet Take 100 mg by mouth daily.     . metoprolol succinate (TOPROL-XL) 50 MG 24 hr tablet Take 1 tablet (50 mg total) by mouth daily. 30 tablet 0  . Misc Natural Products (GLUCOSAMINE CHOND COMPLEX/MSM) TABS Take by mouth.    . Multiple Vitamin (THERA) TABS Take 1 mg by mouth 2 (two) times daily.     . Omega-3 Fatty Acids (FISH OIL) 1000 MG CAPS Take 1,000 mg by mouth daily.    . ondansetron (ZOFRAN) 4 MG tablet Take 4 mg by mouth as needed.    Marland Kitchen OVER THE COUNTER MEDICATION Take 8.3 mg by mouth daily. Hemp oil    .  potassium chloride (K-DUR) 10 MEQ tablet Take 20 mEq by mouth daily.    . QUEtiapine (SEROQUEL) 25 MG tablet Take 25 mg by mouth at bedtime.  5   No current facility-administered medications on file prior to visit.    Allergies  Allergen Reactions  . Cocoa     No results found for this or any previous visit (from the past 2160 hour(s)).  Objective: General: Patient is awake, alert, and oriented x 3 and in no acute distress, wheelchair assisted gait.  Integument: Skin is warm, dry and supple bilateral. Nails are tender, long, thickened and dystrophic  with subungual debris, consistent with onychomycosis, 1-5 bilateral. No signs of infection. No open lesions or preulcerative lesions present bilateral. Remaining integument unremarkable.  Vasculature:  Dorsalis Pedis pulse 1/4 bilateral. Posterior Tibial pulse  1/4 bilateral. Capillary fill time <3 sec 1-5 bilateral. Positive hair growth to the level of the digits.Temperature gradient within normal limits. + varicosities present bilateral. Trace edema present bilateral ankles.   Neurology: The patient has diminished sensation measured with a 5.07/10g Semmes Weinstein Monofilament at all pedal sites bilateral . Vibratory sensation diminished bilateral with tuning fork. No Babinski sign present bilateral.   Musculoskeletal: Asymptomatic hammertoe pedal deformities noted bilateral. Muscular strength 5/5 in all lower extremity muscular groups bilateral without pain on range of motion. No tenderness with calf compression bilateral.  Assessment and Plan: Problem List Items Addressed This Visit    None    Visit Diagnoses    Pain due to onychomycosis of toenails of both feet    -  Primary   Diabetic polyneuropathy associated with diabetes mellitus due to underlying condition ([place])       Blind         -Examined patient. -Discussed and educated patient on diabetic foot care, especially with  regards to the vascular, neurological and musculoskeletal systems.  -Stressed the importance of good glycemic control and the detriment of not  controlling glucose levels in relation to the foot. -Mechanically debrided all nails 1-5 bilateral using sterile nail nipper and filed with dremel without incident  -Continue with wheelchair and assistance from son as previous -Patient to return  in 3 months for at risk foot care -Patient advised to call the office if any problems or questions arise in the meantime.  Landis Martins, DPM

## 2018-02-01 ENCOUNTER — Ambulatory Visit: Payer: Medicare HMO | Admitting: Podiatry

## 2018-02-01 DIAGNOSIS — M79675 Pain in left toe(s): Secondary | ICD-10-CM

## 2018-02-01 DIAGNOSIS — E0842 Diabetes mellitus due to underlying condition with diabetic polyneuropathy: Secondary | ICD-10-CM

## 2018-02-01 DIAGNOSIS — M79674 Pain in right toe(s): Secondary | ICD-10-CM | POA: Diagnosis not present

## 2018-02-01 DIAGNOSIS — B351 Tinea unguium: Secondary | ICD-10-CM

## 2018-02-01 NOTE — Patient Instructions (Signed)
Onychomycosis/Fungal Toenails  WHAT IS IT? An infection that lies within the keratin of your nail plate that is caused by a fungus.  WHY ME? Fungal infections affect all ages, sexes, races, and creeds.  There may be many factors that predispose you to a fungal infection such as age, coexisting medical conditions such as diabetes, or an autoimmune disease; stress, medications, fatigue, genetics, etc.  Bottom line: fungus thrives in a warm, moist environment and your shoes offer such a location.  IS IT CONTAGIOUS? Theoretically, yes.  You do not want to share shoes, nail clippers or files with someone who has fungal toenails.  Walking around barefoot in the same room or sleeping in the same bed is unlikely to transfer the organism.  It is important to realize, however, that fungus can spread easily from one nail to the next on the same foot.  HOW DO WE TREAT THIS?  There are several ways to treat this condition.  Treatment may depend on many factors such as age, medications, pregnancy, liver and kidney conditions, etc.  It is best to ask your doctor which options are available to you.  1. No treatment.   Unlike many other medical concerns, you can live with this condition.  However for many people this can be a painful condition and may lead to ingrown toenails or a bacterial infection.  It is recommended that you keep the nails cut short to help reduce the amount of fungal nail. 2. Topical treatment.  These range from herbal remedies to prescription strength nail lacquers.  About 40-50% effective, topicals require twice daily application for approximately 9 to 12 months or until an entirely new nail has grown out.  The most effective topicals are medical grade medications available through physicians offices. 3. Oral antifungal medications.  With an 80-90% cure rate, the most common oral medication requires 3 to 4 months of therapy and stays in your system for a year as the new nail grows out.  Oral  antifungal medications do require blood work to make sure it is a safe drug for you.  A liver function panel will be performed prior to starting the medication and after the first month of treatment.  It is important to have the blood work performed to avoid any harmful side effects.  In general, this medication safe but blood work is required. 4. Laser Therapy.  This treatment is performed by applying a specialized laser to the affected nail plate.  This therapy is noninvasive, fast, and non-painful.  It is not covered by insurance and is therefore, out of pocket.  The results have been very good with a 80-95% cure rate.  The [facility] is the only practice in the area to offer this therapy. 5. Permanent Nail Avulsion.  Removing the entire nail so that a new nail will not grow back.  Diabetes and Foot Care Diabetes may cause you to have problems because of poor blood supply (circulation) to your feet and legs. This may cause the skin on your feet to become thinner, break easier, and heal more slowly. Your skin may become dry, and the skin may peel and crack. You may also have nerve damage in your legs and feet causing decreased feeling in them. You may not notice minor injuries to your feet that could lead to infections or more serious problems. Taking care of your feet is one of the most important things you can do for yourself. Follow these instructions at home:  Wear   shoes at all times, even in the house. Do not go barefoot. Bare feet are easily injured.  Check your feet daily for blisters, cuts, and redness. If you cannot see the bottom of your feet, use a mirror or ask someone for help.  Wash your feet with warm water (do not use hot water) and mild soap. Then pat your feet and the areas between your toes until they are completely dry. Do not soak your feet as this can dry your skin.  Apply a moisturizing lotion or petroleum jelly (that does not contain alcohol and is unscented) to the  skin on your feet and to dry, brittle toenails. Do not apply lotion between your toes.  Trim your toenails straight across. Do not dig under them or around the cuticle. File the edges of your nails with an emery board or nail file.  Do not cut corns or calluses or try to remove them with medicine.  Wear clean socks or stockings every day. Make sure they are not too tight. Do not wear knee-high stockings since they may decrease blood flow to your legs.  Wear shoes that fit properly and have enough cushioning. To break in new shoes, wear them for just a few hours a day. This prevents you from injuring your feet. Always look in your shoes before you put them on to be sure there are no objects inside.  Do not cross your legs. This may decrease the blood flow to your feet.  If you find a minor scrape, cut, or break in the skin on your feet, keep it and the skin around it clean and dry. These areas may be cleansed with mild soap and water. Do not cleanse the area with peroxide, alcohol, or iodine.  When you remove an adhesive bandage, be sure not to damage the skin around it.  If you have a wound, look at it several times a day to make sure it is healing.  Do not use heating pads or hot water bottles. They may burn your skin. If you have lost feeling in your feet or legs, you may not know it is happening until it is too late.  Make sure your health care provider performs a complete foot exam at least annually or more often if you have foot problems. Report any cuts, sores, or bruises to your health care provider immediately. Contact a health care provider if:  You have an injury that is not healing.  You have cuts or breaks in the skin.  You have an ingrown nail.  You notice redness on your legs or feet.  You feel burning or tingling in your legs or feet.  You have pain or cramps in your legs and feet.  Your legs or feet are numb.  Your feet always feel cold. Get help right away  if:  There is increasing redness, swelling, or pain in or around a wound.  There is a red line that goes up your leg.  Pus is coming from a wound.  You develop a fever or as directed by your health care provider.  You notice a bad smell coming from an ulcer or wound. This information is not intended to replace advice given to you by your health care provider. Make sure you discuss any questions you have with your health care provider. Document Released: 02/21/2000 Document Revised: 08/01/2015 Document Reviewed: 08/02/2012 Elsevier Interactive Patient Education  2017 [employer]  

## 2018-02-20 ENCOUNTER — Encounter: Payer: Self-pay | Admitting: Podiatry

## 2018-02-20 NOTE — Progress Notes (Signed)
Subjective: Robyn Shaw presents today with history of neuropathy with cc of painful, mycotic toenails.  Pain is aggravated when wearing enclosed shoe gear and relieved with periodic professional debridement.  She voices no new problems on today's visit.  Dr. Woody Seller appears to be managing her medically. Her last visit was 09/27/2017.   Current Outpatient Medications:  .  atorvastatin (LIPITOR) 40 MG tablet, Take 40 mg by mouth daily. Pt takes 20 mg, Disp: , Rfl:  .  carvedilol (COREG) 25 MG tablet, Take 25 mg by mouth 2 (two) times daily with a meal., Disp: , Rfl:  .  CINNAMON PO, Take by mouth., Disp: , Rfl:  .  donepezil (ARICEPT) 5 MG tablet, Take 5 mg by mouth daily. , Disp: , Rfl:  .  DROPLET INSULIN SYRINGE 31G X 5/16" 0.3 ML MISC, , Disp: , Rfl:  .  FLUoxetine (PROZAC) 20 MG capsule, Take 20 mg by mouth daily., Disp: , Rfl: 5 .  furosemide (LASIX) 80 MG tablet, Take 80 mg by mouth daily. , Disp: , Rfl:  .  GARLIC PO, Take 5,003 mg by mouth daily., Disp: , Rfl:  .  Ginger, Zingiber officinalis, 250 MG CAPS, Take by mouth., Disp: , Rfl:  .  glucose blood (PRODIGY NO CODING BLOOD GLUC) test strip, USE TO CHECK BLOOD SUGAR 3 TIMES DAILY, Disp: , Rfl:  .  insulin glargine (LANTUS) 100 UNIT/ML injection, Inject 26 Units into the skin at bedtime. , Disp: , Rfl:  .  lisinopril (PRINIVIL,ZESTRIL) 10 MG tablet, Take 20 mg by mouth. 10mg  tablets, 2 tablets in the morning and 1 tablet in the evening, Disp: , Rfl:  .  metFORMIN (GLUCOPHAGE-XR) 500 MG 24 hr tablet, Take 500 mg by mouth every evening., Disp: , Rfl:  .  metoprolol succinate (TOPROL-XL) 100 MG 24 hr tablet, Take 100 mg by mouth daily. , Disp: , Rfl:  .  metoprolol succinate (TOPROL-XL) 100 MG 24 hr tablet, , Disp: , Rfl:  .  metoprolol succinate (TOPROL-XL) 50 MG 24 hr tablet, Take 1 tablet (50 mg total) by mouth daily., Disp: 30 tablet, Rfl: 0 .  Misc Natural Products (GLUCOSAMINE CHOND COMPLEX/MSM) TABS, Take by mouth.,  Disp: , Rfl:  .  Multiple Vitamin (THERA) TABS, Take 1 mg by mouth 2 (two) times daily. , Disp: , Rfl:  .  Omega-3 Fatty Acids (FISH OIL) 1000 MG CAPS, Take 1,000 mg by mouth daily., Disp: , Rfl:  .  ondansetron (ZOFRAN) 4 MG tablet, Take 4 mg by mouth as needed., Disp: , Rfl:  .  OVER THE COUNTER MEDICATION, Take 8.3 mg by mouth daily. Hemp oil, Disp: , Rfl:  .  potassium chloride (K-DUR) 10 MEQ tablet, Take 20 mEq by mouth daily., Disp: , Rfl:  .  potassium chloride SA (K-DUR,KLOR-CON) 20 MEQ tablet, , Disp: , Rfl:  .  QUEtiapine (SEROQUEL) 25 MG tablet, Take 25 mg by mouth at bedtime., Disp: , Rfl: 5  Allergies  Allergen Reactions  . Cocoa     Objective:  Vascular Examination: Capillary refill time  <3 seconds x 10 digits Dorsalis pedis and Posterior tibial pulses 1/4 b/l + Digital hair x 10 digits Skin temperature gradient WNL b/l +Trace edema b/l ankles  +Varicosities b/l  Dermatological Examination: Skin with normal turgor, texture and tone b/l  Toenails 1-5 b/l discolored, thick, dystrophic with subungual debris and pain with palpation to nailbeds due to thickness of nails.  No open wounds.  No interdigital  macerations.  Musculoskeletal: Muscle strength 5/5 to all muscle groups b/l  Hammertoe deformity b/l 2-5   Neurological: Sensation with 10 gram monofilament is absent b/l Vibratory sensation absent b/l  Assessment: 1. Painful onychomycosis toenails 1-5 b/l 2. NIDDM with neuropathy  Plan: 1. Continue diabetic foot care principles. Literature dispensed. 2. Toenails 1-5 b/l were debrided in length and girth without iatrogenic bleeding. 3. Patient to continue soft, supportive shoe gear 4. Patient to report any pedal injuries to medical professional  5. Follow up 3 months. Patient/POA to call should there be a concern in the interim.

## 2018-05-03 ENCOUNTER — Encounter: Payer: Self-pay | Admitting: Podiatry

## 2018-05-03 ENCOUNTER — Ambulatory Visit: Payer: Medicare HMO | Admitting: Podiatry

## 2018-05-03 DIAGNOSIS — B351 Tinea unguium: Secondary | ICD-10-CM | POA: Diagnosis not present

## 2018-05-03 DIAGNOSIS — E0842 Diabetes mellitus due to underlying condition with diabetic polyneuropathy: Secondary | ICD-10-CM

## 2018-05-03 DIAGNOSIS — M79675 Pain in left toe(s): Secondary | ICD-10-CM | POA: Diagnosis not present

## 2018-05-03 DIAGNOSIS — M79674 Pain in right toe(s): Secondary | ICD-10-CM

## 2018-05-03 NOTE — Patient Instructions (Signed)
Onychomycosis/Fungal Toenails  WHAT IS IT? An infection that lies within the keratin of your nail plate that is caused by a fungus.  WHY ME? Fungal infections affect all ages, sexes, races, and creeds.  There may be many factors that predispose you to a fungal infection such as age, coexisting medical conditions such as diabetes, or an autoimmune disease; stress, medications, fatigue, genetics, etc.  Bottom line: fungus thrives in a warm, moist environment and your shoes offer such a location.  IS IT CONTAGIOUS? Theoretically, yes.  You do not want to share shoes, nail clippers or files with someone who has fungal toenails.  Walking around barefoot in the same room or sleeping in the same bed is unlikely to transfer the organism.  It is important to realize, however, that fungus can spread easily from one nail to the next on the same foot.  HOW DO WE TREAT THIS?  There are several ways to treat this condition.  Treatment may depend on many factors such as age, medications, pregnancy, liver and kidney conditions, etc.  It is best to ask your doctor which options are available to you.  1. No treatment.   Unlike many other medical concerns, you can live with this condition.  However for many people this can be a painful condition and may lead to ingrown toenails or a bacterial infection.  It is recommended that you keep the nails cut short to help reduce the amount of fungal nail. 2. Topical treatment.  These range from herbal remedies to prescription strength nail lacquers.  About 40-50% effective, topicals require twice daily application for approximately 9 to 12 months or until an entirely new nail has grown out.  The most effective topicals are medical grade medications available through physicians offices. 3. Oral antifungal medications.  With an 80-90% cure rate, the most common oral medication requires 3 to 4 months of therapy and stays in your system for a year as the new nail grows out.  Oral  antifungal medications do require blood work to make sure it is a safe drug for you.  A liver function panel will be performed prior to starting the medication and after the first month of treatment.  It is important to have the blood work performed to avoid any harmful side effects.  In general, this medication safe but blood work is required. 4. Laser Therapy.  This treatment is performed by applying a specialized laser to the affected nail plate.  This therapy is noninvasive, fast, and non-painful.  It is not covered by insurance and is therefore, out of pocket.  The results have been very good with a 80-95% cure rate.  [facility] is the only practice in the area to offer this therapy. Permanent Nail Avulsion.  Removing the entire nail so that a new nail will not grow back.Diabetic Neuropathy Diabetic neuropathy refers to nerve damage that is caused by diabetes (diabetes mellitus). Over time, people with diabetes can develop nerve damage throughout the body. There are several types of diabetic neuropathy:  Peripheral neuropathy. This is the most common type of diabetic neuropathy. It causes damage to nerves that carry signals between the spinal cord and other parts of the body (peripheral nerves). This usually affects nerves in the feet and legs first, and may eventually affect the hands and arms. The damage affects the ability to sense touch or temperature.  Autonomic neuropathy. This type causes damage to nerves that control involuntary functions (autonomic nerves). These nerves carry   signals that control: ? Heartbeat. ? Body temperature. ? Blood pressure. ? Urination. ? Digestion. ? Sweating. ? Sexual function. ? Response to changing blood sugar (glucose) levels.  Focal neuropathy. This type of nerve damage affects one area of the body, such as an arm, a leg, or the face. The injury may involve one nerve or a small group of nerves. Focal neuropathy can be painful and unpredictable,  and occurs most often in older adults with diabetes. This often develops suddenly, but usually improves over time and does not cause long-term problems.  Proximal neuropathy. This type of nerve damage affects the nerves of the thighs, hips, buttocks, or legs. It causes severe pain, weakness, and muscle death (atrophy), usually in the thigh muscles. It is more common among older men and people who have type 2 diabetes. The length of recovery time may vary. What are the causes? Peripheral, autonomic, and focal neuropathies are caused by diabetes that is not well controlled with treatment. The cause of proximal neuropathy is not known, but it may be caused by inflammation related to uncontrolled blood glucose levels. What are the signs or symptoms? Peripheral neuropathy Peripheral neuropathy develops slowly over time. When the nerves of the feet and legs no longer work, you may experience:  Burning, stabbing, or aching pain in the legs or feet.  Pain or cramping in the legs or feet.  Loss of feeling (numbness) and inability to feel pressure or pain in the feet. This can lead to: ? Thick calluses or sores on areas of constant pressure. ? Ulcers. ? Reduced ability to feel temperature changes.  Foot deformities.  Muscle weakness.  Loss of balance or coordination. Autonomic neuropathy The symptoms of autonomic neuropathy vary depending on which nerves are affected. Symptoms may include:  Problems with digestion, such as: ? Nausea or vomiting. ? Poor appetite. ? Bloating. ? Diarrhea or constipation. ? Trouble swallowing. ? Losing weight without trying to.  Problems with the heart, blood and lungs, such as: ? Dizziness, especially when standing up. ? Fainting. ? Shortness of breath. ? Irregular heartbeat.  Bladder problems, such as: ? Trouble starting or stopping urination. ? Leaking urine. ? Trouble emptying the bladder. ? Urinary tract infections (UTIs).  Problems with other  body functions, such as: ? Sweat. You may sweat too much or too little. ? Temperature. You might get hot easily. Or, you might feel cold more than usual. ? Sexual function. Men may not be able to get or maintain an erection. Women may have vaginal dryness and difficulty with arousal. Focal neuropathy Symptoms affect only one area of the body. Common symptoms include:  Numbness.  Tingling.  Burning pain.  Prickling feeling.  Very sensitive skin.  Weakness.  Inability to move (paralysis).  Muscle twitching.  Muscles getting smaller (wasting).  Poor coordination.  Double or blurred vision. Proximal neuropathy  Sudden, severe pain in the hip, thigh, or buttocks. Pain may spread from the back into the legs (sciatica).  Pain and numbness in the arms and legs.  Tingling.  Loss of bladder control or bowel control.  Weakness and wasting of thigh muscles.  Difficulty getting up from a seated position.  Abdominal swelling.  Unexplained weight loss. How is this diagnosed? Diagnosis usually involves reviewing your medical history and any symptoms you have. Diagnosis varies depending on the type of neuropathy your health care provider suspects. Peripheral neuropathy Your health care provider will check areas that are affected by your nervous system (neurologic exam), such as  your reflexes, how you move, and what you can feel. You may have other tests, such as:  Blood tests.  Removal and examination of fluid that surrounds the spinal cord (lumbar puncture).  CT scan.  MRI.  A test to check the nerves that control muscles (electromyogram, EMG).  Tests of how quickly messages pass through your nerves (nerve conduction velocity tests).  Removal of a small piece of nerve to be examined under a microscope (biopsy). Autonomic neuropathy You may have tests, such as:  Tests to measure your blood pressure and heart rate. This may include monitoring you while you are safely  secured to an exam table that moves you from a lying position to an upright position (table tilt test).  Breathing tests to check your lungs.  Tests to check how food moves through the digestive system (gastric emptying tests).  Blood, sweat, or urine tests.  Ultrasound of your bladder.  Spinal fluid tests. Focal neuropathy This condition may be diagnosed with:  A neurologic exam.  CT scan.  MRI.  EMG.  Nerve conduction velocity tests. Proximal neuropathy There is no test to diagnose this type of neuropathy. You may have tests to rule out other possible causes of this type of neuropathy. Tests may include:  X-rays of your spine and lumbar region.  Lumbar puncture.  MRI. How is this treated? The goal of treatment is to keep nerve damage from getting worse. The most important part of treatment is keeping your blood glucose level and your A1C level within your target range by following your diabetes management plan. Over time, maintaining lower blood glucose levels helps lessen symptoms. In some cases, you may need prescription pain medicine. Follow these instructions at home:  Lifestyle   Do not use any products that contain nicotine or tobacco, such as cigarettes and e-cigarettes. If you need help quitting, ask your health care provider.  Be physically active every day. Include strength training and balance exercises.  Follow a healthy meal plan.  Work with your health care provider to manage your blood pressure. General instructions  Follow your diabetes management plan as directed. ? Check your blood glucose levels as directed by your health care provider. ? Keep your blood glucose in your target range as directed by your health care provider. ? Have your A1C level checked at least two times a year, or as often as told by your health care provider.  Take over the counter and prescription medicines only as told by your health care provider. This includes insulin  and diabetes medicine.  Do not drive or use heavy machinery while taking prescription pain medicines.  Check your skin and feet every day for cuts, bruises, redness, blisters, or sores.  Keep all follow up visits as told by your health care provider. This is important. Contact a health care provider if:  You have burning, stabbing, or aching pain in your legs or feet.  You are unable to feel pressure or pain in your feet.  You develop problems with digestion, such as: ? Nausea. ? Vomiting. ? Bloating. ? Constipation. ? Diarrhea. ? Abdominal pain.  You have difficulty with urination, such as inability: ? To control when you urinate (incontinence). ? To completely empty the bladder (retention).  You have palpitations.  You feel dizzy, weak, or faint when you stand up. Get help right away if:  You cannot urinate.  You have sudden weakness or loss of coordination.  You have trouble speaking.  You have pain or   pressure in your chest.  You have an irregular heart beat.  You have sudden inability to move a part of your body. Summary  Diabetic neuropathy refers to nerve damage that is caused by diabetes. It can affect nerves throughout the entire body, causing numbness and pain in the arms, legs, digestive tract, heart, and other body systems.  Keep your blood glucose level and your blood pressure in your target range, as directed by your health care provider. This can help prevent neuropathy from getting worse.  Check your skin and feet every day for cuts, bruises, redness, blisters, or sores.  Do not use any products that contain nicotine or tobacco, such as cigarettes and e-cigarettes. If you need help quitting, ask your health care provider. This information is not intended to replace advice given to you by your health care provider. Make sure you discuss any questions you have with your health care provider. Document Released: 05/04/2001 Document Revised: 04/07/2017  Document Reviewed: 03/30/2016 Elsevier Interactive Patient Education  2019 [employer]  

## 2018-05-07 NOTE — Progress Notes (Signed)
Subjective: Robyn Shaw presents today with history of diabetic neuropathy with cc of painful, mycotic toenails.  Pain is aggravated when wearing enclosed shoe gear and relieved with periodic professional debridement.  [name], MD is her PCP. Last visit 04/01/2018.   Current Outpatient Medications:  .  atorvastatin (LIPITOR) 40 MG tablet, Take 40 mg by mouth daily. Pt takes 20 mg, Disp: , Rfl:  .  carvedilol (COREG) 25 MG tablet, Take 25 mg by mouth 2 (two) times daily with a meal., Disp: , Rfl:  .  CINNAMON PO, Take by mouth., Disp: , Rfl:  .  donepezil (ARICEPT) 5 MG tablet, Take 5 mg by mouth daily. , Disp: , Rfl:  .  DROPLET INSULIN SYRINGE 31G X 5/16" 0.3 ML MISC, , Disp: , Rfl:  .  FLUoxetine (PROZAC) 20 MG capsule, Take 20 mg by mouth daily., Disp: , Rfl: 5 .  furosemide (LASIX) 80 MG tablet, Take 80 mg by mouth daily. , Disp: , Rfl:  .  GARLIC PO, Take 7,741 mg by mouth daily., Disp: , Rfl:  .  Ginger, Zingiber officinalis, 250 MG CAPS, Take by mouth., Disp: , Rfl:  .  glucose blood (PRODIGY NO CODING BLOOD GLUC) test strip, USE TO CHECK BLOOD SUGAR 3 TIMES DAILY, Disp: , Rfl:  .  insulin glargine (LANTUS) 100 UNIT/ML injection, Inject 26 Units into the skin at bedtime. , Disp: , Rfl:  .  lisinopril (PRINIVIL,ZESTRIL) 10 MG tablet, Take 20 mg by mouth. 10mg  tablets, 2 tablets in the morning and 1 tablet in the evening, Disp: , Rfl:  .  metFORMIN (GLUCOPHAGE-XR) 500 MG 24 hr tablet, Take 500 mg by mouth every evening., Disp: , Rfl:  .  metoprolol succinate (TOPROL-XL) 100 MG 24 hr tablet, Take 100 mg by mouth daily. , Disp: , Rfl:  .  metoprolol succinate (TOPROL-XL) 100 MG 24 hr tablet, , Disp: , Rfl:  .  metoprolol succinate (TOPROL-XL) 50 MG 24 hr tablet, Take 1 tablet (50 mg total) by mouth daily., Disp: 30 tablet, Rfl: 0 .  Misc Natural Products (GLUCOSAMINE CHOND COMPLEX/MSM) TABS, Take by mouth., Disp: , Rfl:  .  Multiple Vitamin (THERA) TABS, Take 1 mg by mouth 2 (two)  times daily. , Disp: , Rfl:  .  Omega-3 Fatty Acids (FISH OIL) 1000 MG CAPS, Take 1,000 mg by mouth daily., Disp: , Rfl:  .  ondansetron (ZOFRAN) 4 MG tablet, Take 4 mg by mouth as needed., Disp: , Rfl:  .  OVER THE COUNTER MEDICATION, Take 8.3 mg by mouth daily. Hemp oil, Disp: , Rfl:  .  potassium chloride (K-DUR) 10 MEQ tablet, Take 20 mEq by mouth daily., Disp: , Rfl:  .  potassium chloride SA (K-DUR,KLOR-CON) 20 MEQ tablet, , Disp: , Rfl:  .  QUEtiapine (SEROQUEL) 25 MG tablet, Take 25 mg by mouth at bedtime., Disp: , Rfl: 5  Allergies  Allergen Reactions  . Cocoa     Objective:  Vascular Examination: Capillary refill time <3 seconds  x 10 digits  Dorsalis pedis and Posterior tibial pulses 1/4 b/l  Digital hair x 10 digits was present   Skin temperature gradient WNL b/l  +trace edema b/l LE  + Varicosities b/l LE  Dermatological Examination: Skin with normal turgor, texture and tone b/l  Toenails 1-5 b/l discolored, thick, dystrophic with subungual debris and pain with palpation to nailbeds due to thickness of nails.  Musculoskeletal: Muscle strength 5/5 to all muscle groups b/l  Hammertoes  2-5 b/l   Neurological: Sensation with 10 gram monofilament is absent b/l.  Vibratory sensation absent b/l.  Assessment: 1. Painful onychomycosis toenails 1-5 b/l 2. NIDDM with neuropathy  Plan: 1. Toenails 1-5 b/l were debrided in length and girth without iatrogenic bleeding. 2. Patient to continue soft, supportive shoe gear. 3. Patient to report any pedal injuries to medical professional. 4. Follow up 3 months.  5. Patient/POA to call should there be a concern in the interim.

## 2018-08-02 ENCOUNTER — Ambulatory Visit: Payer: Medicare HMO | Admitting: Podiatry

## 2018-08-02 ENCOUNTER — Encounter: Payer: Self-pay | Admitting: Podiatry

## 2018-08-02 ENCOUNTER — Other Ambulatory Visit: Payer: Self-pay

## 2018-08-02 VITALS — Temp 96.6°F

## 2018-08-02 DIAGNOSIS — B351 Tinea unguium: Secondary | ICD-10-CM

## 2018-08-02 DIAGNOSIS — E0842 Diabetes mellitus due to underlying condition with diabetic polyneuropathy: Secondary | ICD-10-CM | POA: Diagnosis not present

## 2018-08-02 DIAGNOSIS — M79675 Pain in left toe(s): Secondary | ICD-10-CM | POA: Diagnosis not present

## 2018-08-02 DIAGNOSIS — M79674 Pain in right toe(s): Secondary | ICD-10-CM

## 2018-08-02 NOTE — Patient Instructions (Signed)
Diabetes Mellitus and Foot Care  Foot care is an important part of your health, especially when you have diabetes. Diabetes may cause you to have problems because of poor blood flow (circulation) to your feet and legs, which can cause your skin to:   Become thinner and drier.   Break more easily.   Heal more slowly.   Peel and crack.  You may also have nerve damage (neuropathy) in your legs and feet, causing decreased feeling in them. This means that you may not notice minor injuries to your feet that could lead to more serious problems. Noticing and addressing any potential problems early is the best way to prevent future foot problems.  How to care for your feet  Foot hygiene   Wash your feet daily with warm water and mild soap. Do not use hot water. Then, pat your feet and the areas between your toes until they are completely dry. Do not soak your feet as this can dry your skin.   Trim your toenails straight across. Do not dig under them or around the cuticle. File the edges of your nails with an emery board or nail file.   Apply a moisturizing lotion or petroleum jelly to the skin on your feet and to dry, brittle toenails. Use lotion that does not contain alcohol and is unscented. Do not apply lotion between your toes.  Shoes and socks   Wear clean socks or stockings every day. Make sure they are not too tight. Do not wear knee-high stockings since they may decrease blood flow to your legs.   Wear shoes that fit properly and have enough cushioning. Always look in your shoes before you put them on to be sure there are no objects inside.   To break in new shoes, wear them for just a few hours a day. This prevents injuries on your feet.  Wounds, scrapes, corns, and calluses   Check your feet daily for blisters, cuts, bruises, sores, and redness. If you cannot see the bottom of your feet, use a mirror or ask someone for help.   Do not cut corns or calluses or try to remove them with medicine.   If you  find a minor scrape, cut, or break in the skin on your feet, keep it and the skin around it clean and dry. You may clean these areas with mild soap and water. Do not clean the area with peroxide, alcohol, or iodine.   If you have a wound, scrape, corn, or callus on your foot, look at it several times a day to make sure it is healing and not infected. Check for:  ? Redness, swelling, or pain.  ? Fluid or blood.  ? Warmth.  ? Pus or a bad smell.  General instructions   Do not cross your legs. This may decrease blood flow to your feet.   Do not use heating pads or hot water bottles on your feet. They may burn your skin. If you have lost feeling in your feet or legs, you may not know this is happening until it is too late.   Protect your feet from hot and cold by wearing shoes, such as at the beach or on hot pavement.   Schedule a complete foot exam at least once a year (annually) or more often if you have foot problems. If you have foot problems, report any cuts, sores, or bruises to your health care provider immediately.  Contact a health care provider if:     You have a medical condition that increases your risk of infection and you have any cuts, sores, or bruises on your feet.   You have an injury that is not healing.   You have redness on your legs or feet.   You feel burning or tingling in your legs or feet.   You have pain or cramps in your legs and feet.   Your legs or feet are numb.   Your feet always feel cold.   You have pain around a toenail.  Get help right away if:   You have a wound, scrape, corn, or callus on your foot and:  ? You have pain, swelling, or redness that gets worse.  ? You have fluid or blood coming from the wound, scrape, corn, or callus.  ? Your wound, scrape, corn, or callus feels warm to the touch.  ? You have pus or a bad smell coming from the wound, scrape, corn, or callus.  ? You have a fever.  ? You have a red line going up your leg.  Summary   Check your feet every day  for cuts, sores, red spots, swelling, and blisters.   Moisturize feet and legs daily.   Wear shoes that fit properly and have enough cushioning.   If you have foot problems, report any cuts, sores, or bruises to your health care provider immediately.   Schedule a complete foot exam at least once a year (annually) or more often if you have foot problems.  This information is not intended to replace advice given to you by your health care provider. Make sure you discuss any questions you have with your health care provider.  Document Released: 02/21/2000 Document Revised: 04/07/2017 Document Reviewed: 03/27/2016  Elsevier Interactive Patient Education  2019 [employer]

## 2018-08-07 ENCOUNTER — Encounter: Payer: Self-pay | Admitting: Podiatry

## 2018-08-07 NOTE — Progress Notes (Signed)
Subjective: Robyn Shaw presents today with history of neuropathy. Patient seen for follow up of chronic, painful mycotic toenailswhich interfere with daily activities and routine tasks.  Pain is aggravated when wearing enclosed shoe gear. Pain is getting progressively worse and relieved with periodic professional debridement.   [name], MD is her PCP.   Current Outpatient Medications:  .  Alcohol Swabs (B-D SINGLE USE SWABS REGULAR) PADS, , Disp: , Rfl:  .  atorvastatin (LIPITOR) 40 MG tablet, Take 40 mg by mouth daily. Pt takes 20 mg, Disp: , Rfl:  .  carvedilol (COREG) 25 MG tablet, Take 25 mg by mouth 2 (two) times daily with a meal., Disp: , Rfl:  .  CINNAMON PO, Take by mouth., Disp: , Rfl:  .  donepezil (ARICEPT) 5 MG tablet, Take 5 mg by mouth daily. , Disp: , Rfl:  .  DROPLET INSULIN SYRINGE 31G X 5/16" 0.3 ML MISC, , Disp: , Rfl:  .  FLUoxetine (PROZAC) 20 MG capsule, Take 20 mg by mouth daily., Disp: , Rfl: 5 .  furosemide (LASIX) 80 MG tablet, Take 80 mg by mouth daily. , Disp: , Rfl:  .  GARLIC PO, Take 6,378 mg by mouth daily., Disp: , Rfl:  .  Ginger, Zingiber officinalis, 250 MG CAPS, Take by mouth., Disp: , Rfl:  .  glucose blood (PRODIGY NO CODING BLOOD GLUC) test strip, USE TO CHECK BLOOD SUGAR 3 TIMES DAILY, Disp: , Rfl:  .  insulin glargine (LANTUS) 100 UNIT/ML injection, Inject 26 Units into the skin at bedtime. , Disp: , Rfl:  .  lisinopril (PRINIVIL,ZESTRIL) 10 MG tablet, Take 20 mg by mouth. 10mg  tablets, 2 tablets in the morning and 1 tablet in the evening, Disp: , Rfl:  .  metFORMIN (GLUCOPHAGE-XR) 500 MG 24 hr tablet, Take 500 mg by mouth every evening., Disp: , Rfl:  .  metoprolol succinate (TOPROL-XL) 100 MG 24 hr tablet, Take 100 mg by mouth daily. , Disp: , Rfl:  .  metoprolol succinate (TOPROL-XL) 100 MG 24 hr tablet, , Disp: , Rfl:  .  metoprolol succinate (TOPROL-XL) 50 MG 24 hr tablet, Take 1 tablet (50 mg total) by mouth daily., Disp: 30 tablet,  Rfl: 0 .  Misc Natural Products (GLUCOSAMINE CHOND COMPLEX/MSM) TABS, Take by mouth., Disp: , Rfl:  .  Multiple Vitamin (THERA) TABS, Take 1 mg by mouth 2 (two) times daily. , Disp: , Rfl:  .  Omega-3 Fatty Acids (FISH OIL) 1000 MG CAPS, Take 1,000 mg by mouth daily., Disp: , Rfl:  .  ondansetron (ZOFRAN) 4 MG tablet, Take 4 mg by mouth as needed., Disp: , Rfl:  .  OVER THE COUNTER MEDICATION, Take 8.3 mg by mouth daily. Hemp oil, Disp: , Rfl:  .  potassium chloride (K-DUR) 10 MEQ tablet, Take 20 mEq by mouth daily., Disp: , Rfl:  .  potassium chloride SA (K-DUR,KLOR-CON) 20 MEQ tablet, , Disp: , Rfl:  .  QUEtiapine (SEROQUEL) 25 MG tablet, Take 25 mg by mouth at bedtime., Disp: , Rfl: 5  Allergies  Allergen Reactions  . Cocoa     Objective: Vitals:   08/02/18 1419  Temp: (!) 96.6 F (35.9 C)    Vascular Examination: Capillary refill time less than 3 seconds x 10 digits.  Dorsalis pedis pulses and posterior tibial pulses 1/4 bilaterally.  Digital hair present x 10 digits.  Skin temperature within normal limits bilaterally.  Positive trace edema noted bilaterally.  Varicosities noted bilateral lower  extremities.  No open wounds and no increased warmth noted bilaterally.  No pain with calf compression noted bilaterally.  Dermatological Examination: Skin with normal turgor, texture and tone bilaterally. Toenails 1-5 b/l discolored, thick, dystrophic with subungual debris and pain with palpation to nailbeds due to thickness of nails.  Musculoskeletal: Muscle strength 5/5 to all LE muscle groups.  Hammertoe deformity noted digits 2 through 5 bilaterally.  Neurological: Sensation absent when tested with 10 gram monofilament.  Vibratory sensation absent bilaterally.  Assessment: 1. Painful onychomycosis toenails 1-5 b/l 2. NIDDM with neuropathy 3.  Plan: 1. Toenails 1-5 b/l were debrided in length and girth without iatrogenic bleeding. 2. Patient to continue soft,  supportive shoe gear daily. 3. Patient to report any pedal injuries to medical professional immediately. 4. Follow up 3 months.  5. Patient/POA to call should there be a concern in the interim.

## 2018-11-07 ENCOUNTER — Other Ambulatory Visit: Payer: Self-pay

## 2018-11-07 ENCOUNTER — Ambulatory Visit: Payer: Medicare HMO | Admitting: Podiatry

## 2018-11-07 DIAGNOSIS — M79674 Pain in right toe(s): Secondary | ICD-10-CM

## 2018-11-07 DIAGNOSIS — M79675 Pain in left toe(s): Secondary | ICD-10-CM

## 2018-11-07 DIAGNOSIS — B353 Tinea pedis: Secondary | ICD-10-CM

## 2018-11-07 DIAGNOSIS — B351 Tinea unguium: Secondary | ICD-10-CM | POA: Diagnosis not present

## 2018-11-07 DIAGNOSIS — E0842 Diabetes mellitus due to underlying condition with diabetic polyneuropathy: Secondary | ICD-10-CM

## 2018-11-07 MED ORDER — CLOTRIMAZOLE 1 % EX CREA: TOPICAL_CREAM | CUTANEOUS | 1 refills | Status: AC

## 2018-11-07 NOTE — Patient Instructions (Addendum)
Athlete's Foot  Athlete's foot (tinea pedis) is a fungal infection of the skin on your feet. It often occurs on the skin that is between or underneath the toes. It can also occur on the soles of your feet. The infection can spread from person to person (is contagious). It can also spread when a person's bare feet come in contact with the fungus on shower floors or on items such as shoes. What are the causes? This condition is caused by a fungus that grows in warm, moist places. You can get athlete's foot by sharing shoes, shower stalls, towels, and wet floors with someone who is infected. Not washing your feet or changing your socks often enough can also lead to athlete's foot. What increases the risk? This condition is more likely to develop in:  Men.  People who have a weak body defense system (immune system).  People who have diabetes.  People who use public showers, such as at a gym.  People who wear heavy-duty shoes, such as industrial or military shoes.  Seasons with warm, humid weather. What are the signs or symptoms? Symptoms of this condition include:  Itchy areas between your toes or on the soles of your feet.  White, flaky, or scaly areas between your toes or on the soles of your feet.  Very itchy small blisters between your toes or on the soles of your feet.  Small cuts in your skin. These cuts can become infected.  Thick or discolored toenails. How is this diagnosed? This condition may be diagnosed with a physical exam and a review of your medical history. Your health care provider may also take a skin or toenail sample to examine under a microscope. How is this treated? This condition is treated with antifungal medicines. These may be applied as powders, ointments, or creams. In severe cases, an oral antifungal medicine may be given. Follow these instructions at home: Medicines  Apply or take over-the-counter and prescription medicines only as told by your health  care provider.  Apply your antifungal medicine as told by your health care provider. Do not stop using the antifungal even if your condition improves. Foot care  Do not scratch your feet.  Keep your feet dry: ? Wear cotton or wool socks. Change your socks every day or if they become wet. ? Wear shoes that allow air to flow, such as sandals or canvas tennis shoes.  Wash and dry your feet, including the area between your toes. Also, wash and dry your feet: ? Every day or as told by your health care provider. ? After exercising. General instructions  Do not let others use towels, shoes, nail clippers, or other personal items that touch your feet.  Protect your feet by wearing sandals in wet areas, such as locker rooms and shared showers.  Keep all follow-up visits as told by your health care provider. This is important.  If you have diabetes, keep your blood sugar under control. Contact a health care provider if:  You have a fever.  You have swelling, soreness, warmth, or redness in your foot.  Your feet are not getting better with treatment.  Your symptoms get worse.  You have new symptoms. Summary  Athlete's foot (tinea pedis) is a fungal infection of the skin on your feet. It often occurs on skin that is between or underneath the toes.  This condition is caused by a fungus that grows in warm, moist places.  Symptoms include white, flaky, or scaly areas between   your toes or on the soles of your feet.  This condition is treated with antifungal medicines.  Keep your feet clean. Always dry them thoroughly. This information is not intended to replace advice given to you by your health care provider. Make sure you discuss any questions you have with your health care provider. Document Released: 02/21/2000 Document Revised: 02/18/2017 Document Reviewed: 12/14/2016 Elsevier Patient Education  2020 [employer]   Diabetes Mellitus and Foot Care Foot care is an important  part of your health, especially when you have diabetes. Diabetes may cause you to have problems because of poor blood flow (circulation) to your feet and legs, which can cause your skin to:  Become thinner and drier.  Break more easily.  Heal more slowly.  Peel and crack. You may also have nerve damage (neuropathy) in your legs and feet, causing decreased feeling in them. This means that you may not notice minor injuries to your feet that could lead to more serious problems. Noticing and addressing any potential problems early is the best way to prevent future foot problems. How to care for your feet Foot hygiene  Wash your feet daily with warm water and mild soap. Do not use hot water. Then, pat your feet and the areas between your toes until they are completely dry. Do not soak your feet as this can dry your skin.  Trim your toenails straight across. Do not dig under them or around the cuticle. File the edges of your nails with an emery board or nail file.  Apply a moisturizing lotion or petroleum jelly to the skin on your feet and to dry, brittle toenails. Use lotion that does not contain alcohol and is unscented. Do not apply lotion between your toes. Shoes and socks  Wear clean socks or stockings every day. Make sure they are not too tight. Do not wear knee-high stockings since they may decrease blood flow to your legs.  Wear shoes that fit properly and have enough cushioning. Always look in your shoes before you put them on to be sure there are no objects inside.  To break in new shoes, wear them for just a few hours a day. This prevents injuries on your feet. Wounds, scrapes, corns, and calluses  Check your feet daily for blisters, cuts, bruises, sores, and redness. If you cannot see the bottom of your feet, use a mirror or ask someone for help.  Do not cut corns or calluses or try to remove them with medicine.  If you find a minor scrape, cut, or break in the skin on your feet,  keep it and the skin around it clean and dry. You may clean these areas with mild soap and water. Do not clean the area with peroxide, alcohol, or iodine.  If you have a wound, scrape, corn, or callus on your foot, look at it several times a day to make sure it is healing and not infected. Check for: ? Redness, swelling, or pain. ? Fluid or blood. ? Warmth. ? Pus or a bad smell. General instructions  Do not cross your legs. This may decrease blood flow to your feet.  Do not use heating pads or hot water bottles on your feet. They may burn your skin. If you have lost feeling in your feet or legs, you may not know this is happening until it is too late.  Protect your feet from hot and cold by wearing shoes, such as at the beach or on hot pavement.    Schedule a complete foot exam at least once a year (annually) or more often if you have foot problems. If you have foot problems, report any cuts, sores, or bruises to your health care provider immediately. Contact a health care provider if:  You have a medical condition that increases your risk of infection and you have any cuts, sores, or bruises on your feet.  You have an injury that is not healing.  You have redness on your legs or feet.  You feel burning or tingling in your legs or feet.  You have pain or cramps in your legs and feet.  Your legs or feet are numb.  Your feet always feel cold.  You have pain around a toenail. Get help right away if:  You have a wound, scrape, corn, or callus on your foot and: ? You have pain, swelling, or redness that gets worse. ? You have fluid or blood coming from the wound, scrape, corn, or callus. ? Your wound, scrape, corn, or callus feels warm to the touch. ? You have pus or a bad smell coming from the wound, scrape, corn, or callus. ? You have a fever. ? You have a red line going up your leg. Summary  Check your feet every day for cuts, sores, red spots, swelling, and blisters.   Moisturize feet and legs daily.  Wear shoes that fit properly and have enough cushioning.  If you have foot problems, report any cuts, sores, or bruises to your health care provider immediately.  Schedule a complete foot exam at least once a year (annually) or more often if you have foot problems. This information is not intended to replace advice given to you by your health care provider. Make sure you discuss any questions you have with your health care provider. Document Released: 02/21/2000 Document Revised: 04/07/2017 Document Reviewed: 03/27/2016 Elsevier Patient Education  2020 [employer]   Onychomycosis/Fungal Toenails  WHAT IS IT? An infection that lies within the keratin of your nail plate that is caused by a fungus.  WHY ME? Fungal infections affect all ages, sexes, races, and creeds.  There may be many factors that predispose you to a fungal infection such as age, coexisting medical conditions such as diabetes, or an autoimmune disease; stress, medications, fatigue, genetics, etc.  Bottom line: fungus thrives in a warm, moist environment and your shoes offer such a location.  IS IT CONTAGIOUS? Theoretically, yes.  You do not want to share shoes, nail clippers or files with someone who has fungal toenails.  Walking around barefoot in the same room or sleeping in the same bed is unlikely to transfer the organism.  It is important to realize, however, that fungus can spread easily from one nail to the next on the same foot.  HOW DO WE TREAT THIS?  There are several ways to treat this condition.  Treatment may depend on many factors such as age, medications, pregnancy, liver and kidney conditions, etc.  It is best to ask your doctor which options are available to you.  1. No treatment.   Unlike many other medical concerns, you can live with this condition.  However for many people this can be a painful condition and may lead to ingrown toenails or a bacterial infection.  It is  recommended that you keep the nails cut short to help reduce the amount of fungal nail. 2. Topical treatment.  These range from herbal remedies to prescription strength nail lacquers.  About 40-50% effective, topicals require   daily application for approximately 9 to 12 months or until an entirely new nail has grown out.  The most effective topicals are medical grade medications available through physicians offices. 3. Oral antifungal medications.  With an 80-90% cure rate, the most common oral medication requires 3 to 4 months of therapy and stays in your system for a year as the new nail grows out.  Oral antifungal medications do require blood work to make sure it is a safe drug for you.  A liver function panel will be performed prior to starting the medication and after the first month of treatment.  It is important to have the blood work performed to avoid any harmful side effects.  In general, this medication safe but blood work is required. 4. Laser Therapy.  This treatment is performed by applying a specialized laser to the affected nail plate.  This therapy is noninvasive, fast, and non-painful.  It is not covered by insurance and is therefore, out of pocket.  The results have been very good with a 80-95% cure rate.  The [facility] is the only practice in the area to offer this therapy. 5. Permanent Nail Avulsion.  Removing the entire nail so that a new nail will not grow back.

## 2018-11-14 ENCOUNTER — Encounter: Payer: Self-pay | Admitting: Podiatry

## 2018-11-14 NOTE — Progress Notes (Signed)
Subjective: Robyn Shaw is seen today for follow up painful, elongated, thickened toenails 1-5 b/l feet that she cannot cut. Pain interferes with daily activities. Aggravating factor includes wearing enclosed shoe gear and relieved with periodic debridement.  Objective:  Neurovascular status unchanged: CFT <3 seconds x 10 digits.   DP/PT pulses 1/4 b/l.  Digital hair present x 10 digits.  Skin temperature gradient WNL b/l.  Trace edema BLE.   +Varicosities BLE.  No open wounds. No pain with calf compression.  Sensation diminished b/l with 10 gram monofilament.  Dermatological Examination: Skin with normal turgor, texture and tone b/l.  Diffuse scaling noted peripherally and plantarly b/l feet with mild foot odor.  No interdigital macerations.  No blisters, no weeping. No signs of secondary bacterial infection noted.  Toenails 1-5 b/l discolored, thick, dystrophic with subungual debris and pain with palpation to nailbeds due to thickness of nails.  Musculoskeletal: Muscle strength 5/5 to all LE muscle groups  Hammertoes 2-5 b/l.  No pain, crepitus or joint limitation noted with ROM.   Assessment: Painful onychomycosis toenails 1-5 b/l  Tinea pedis b/l NIDDM with neuropathy  Plan: 1. Toenails 1-5 b/l were debrided in length and girth without iatrogenic bleeding. 2. Rx for Clotrimazole Cream 1% to be applied to both feet and between toes bid x 4 weeks 3. Patient to continue soft, supportive shoe gear. 4. Patient to report any pedal injuries to medical professional immediately. 5. Follow up 3 months.  6. Patient/POA to call should there be a concern in the interim.

## 2019-02-13 ENCOUNTER — Ambulatory Visit: Payer: Medicare HMO | Admitting: Podiatry

## 2019-08-08 DEATH — deceased
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
